# Patient Record
Sex: Female | Born: 1985 | Race: White | Hispanic: No | Marital: Married | Smoking: Never smoker
Health system: Southern US, Community
[De-identification: ages and names within clinical notes are randomized; demographics above are authoritative.]

## PROBLEM LIST (undated history)

## (undated) ENCOUNTER — Inpatient Hospital Stay (HOSPITAL_COMMUNITY): Payer: Self-pay

## (undated) DIAGNOSIS — E039 Hypothyroidism, unspecified: Secondary | ICD-10-CM

## (undated) HISTORY — DX: Hypothyroidism, unspecified: E03.9

---

## 2016-01-02 LAB — OB RESULTS CONSOLE GC/CHLAMYDIA
CHLAMYDIA, DNA PROBE: NEGATIVE
GC PROBE AMP, GENITAL: NEGATIVE

## 2016-01-02 LAB — OB RESULTS CONSOLE RUBELLA ANTIBODY, IGM: Rubella: IMMUNE

## 2016-01-02 LAB — OB RESULTS CONSOLE HEPATITIS B SURFACE ANTIGEN: Hepatitis B Surface Ag: NEGATIVE

## 2016-01-02 LAB — OB RESULTS CONSOLE ABO/RH: RH Type: POSITIVE

## 2016-01-02 LAB — OB RESULTS CONSOLE RPR: RPR: NONREACTIVE

## 2016-01-02 LAB — OB RESULTS CONSOLE HIV ANTIBODY (ROUTINE TESTING): HIV: NONREACTIVE

## 2016-01-02 LAB — OB RESULTS CONSOLE ANTIBODY SCREEN: Antibody Screen: NEGATIVE

## 2016-07-08 NOTE — L&D Delivery Note (Signed)
Delivery Note At 4:42 AM a viable and healthy female was delivered via Vaginal, Spontaneous Delivery (Presentation:ROA, ;  vtx).  APGAR: 8, 9; weight 8 lb 11.9 oz (3966 g).   Placenta status: spontaneous, intact to path , .  Cord:  with the following complications:none .  Cord pH: none  Anesthesia:  epidural Episiotomy: None Lacerations:  Left Labial majus; left Vaginal Sulcus Suture Repair: 3.0 chromic Est. Blood Loss (mL): 300  Mom to postpartum.  Baby to Couplet care / Skin to Skin.  Jasmine Ray A 08/07/2016, 5:30 AM

## 2016-07-12 DIAGNOSIS — Z794 Long term (current) use of insulin: Secondary | ICD-10-CM | POA: Diagnosis not present

## 2016-07-12 LAB — OB RESULTS CONSOLE GBS: STREP GROUP B AG: POSITIVE

## 2016-07-13 DIAGNOSIS — Z9641 Presence of insulin pump (external) (internal): Secondary | ICD-10-CM | POA: Diagnosis not present

## 2016-07-13 DIAGNOSIS — E109 Type 1 diabetes mellitus without complications: Secondary | ICD-10-CM | POA: Diagnosis not present

## 2016-07-13 DIAGNOSIS — E1065 Type 1 diabetes mellitus with hyperglycemia: Secondary | ICD-10-CM | POA: Diagnosis not present

## 2016-07-19 DIAGNOSIS — Z9641 Presence of insulin pump (external) (internal): Secondary | ICD-10-CM | POA: Diagnosis not present

## 2016-07-19 DIAGNOSIS — E109 Type 1 diabetes mellitus without complications: Secondary | ICD-10-CM | POA: Diagnosis not present

## 2016-07-19 DIAGNOSIS — Z794 Long term (current) use of insulin: Secondary | ICD-10-CM | POA: Diagnosis not present

## 2016-07-19 DIAGNOSIS — E1065 Type 1 diabetes mellitus with hyperglycemia: Secondary | ICD-10-CM | POA: Diagnosis not present

## 2016-07-25 ENCOUNTER — Telehealth (HOSPITAL_COMMUNITY): Payer: Self-pay | Admitting: *Deleted

## 2016-07-25 ENCOUNTER — Encounter (HOSPITAL_COMMUNITY): Payer: Self-pay | Admitting: *Deleted

## 2016-07-26 DIAGNOSIS — Z794 Long term (current) use of insulin: Secondary | ICD-10-CM | POA: Diagnosis not present

## 2016-07-30 ENCOUNTER — Other Ambulatory Visit: Payer: Self-pay | Admitting: Obstetrics and Gynecology

## 2016-07-30 NOTE — Telephone Encounter (Signed)
Preadmission screen  

## 2016-08-02 DIAGNOSIS — Z794 Long term (current) use of insulin: Secondary | ICD-10-CM | POA: Diagnosis not present

## 2016-08-04 ENCOUNTER — Inpatient Hospital Stay (HOSPITAL_COMMUNITY): Payer: 59

## 2016-08-04 ENCOUNTER — Other Ambulatory Visit: Payer: Self-pay | Admitting: Obstetrics and Gynecology

## 2016-08-04 ENCOUNTER — Encounter (HOSPITAL_COMMUNITY): Payer: Self-pay | Admitting: *Deleted

## 2016-08-04 ENCOUNTER — Inpatient Hospital Stay (HOSPITAL_COMMUNITY)
Admission: AD | Admit: 2016-08-04 | Discharge: 2016-08-04 | Disposition: A | Discharge status: Home / Self Care | Payer: 59 | Source: Ambulatory Visit | Attending: Obstetrics and Gynecology | Admitting: Obstetrics and Gynecology

## 2016-08-04 DIAGNOSIS — O24013 Pre-existing diabetes mellitus, type 1, in pregnancy, third trimester: Secondary | ICD-10-CM

## 2016-08-04 DIAGNOSIS — Z3A38 38 weeks gestation of pregnancy: Secondary | ICD-10-CM | POA: Insufficient documentation

## 2016-08-04 DIAGNOSIS — O24319 Unspecified pre-existing diabetes mellitus in pregnancy, unspecified trimester: Secondary | ICD-10-CM

## 2016-08-04 DIAGNOSIS — O99283 Endocrine, nutritional and metabolic diseases complicating pregnancy, third trimester: Secondary | ICD-10-CM

## 2016-08-04 DIAGNOSIS — O24313 Unspecified pre-existing diabetes mellitus in pregnancy, third trimester: Secondary | ICD-10-CM

## 2016-08-04 DIAGNOSIS — O24419 Gestational diabetes mellitus in pregnancy, unspecified control: Secondary | ICD-10-CM

## 2016-08-04 DIAGNOSIS — E039 Hypothyroidism, unspecified: Secondary | ICD-10-CM

## 2016-08-04 DIAGNOSIS — O2402 Pre-existing diabetes mellitus, type 1, in childbirth: Secondary | ICD-10-CM | POA: Diagnosis not present

## 2016-08-04 NOTE — Discharge Instructions (Signed)
Third Trimester of Pregnancy °The third trimester is from week 29 through week 42, months 7 through 9. This trimester is when your unborn baby (fetus) is growing very fast. At the end of the ninth month, the unborn baby is about 20 inches in length. It weighs about 6-10 pounds. °Follow these instructions at home: °· Avoid all smoking, herbs, and alcohol. Avoid drugs not approved by your doctor. °· Do not use any tobacco products, including cigarettes, chewing tobacco, and electronic cigarettes. If you need help quitting, ask your doctor. You may get counseling or other support to help you quit. °· Only take medicine as told by your doctor. Some medicines are safe and some are not during pregnancy. °· Exercise only as told by your doctor. Stop exercising if you start having cramps. °· Eat regular, healthy meals. °· Wear a good support bra if your breasts are tender. °· Do not use hot tubs, steam rooms, or saunas. °· Wear your seat belt when driving. °· Avoid raw meat, uncooked cheese, and liter boxes and soil used by cats. °· Take your prenatal vitamins. °· Take 1500-2000 milligrams of calcium daily starting at the 20th week of pregnancy until you deliver your baby. °· Try taking medicine that helps you poop (stool softener) as needed, and if your doctor approves. Eat more fiber by eating fresh fruit, vegetables, and whole grains. Drink enough fluids to keep your pee (urine) clear or pale yellow. °· Take warm water baths (sitz baths) to soothe pain or discomfort caused by hemorrhoids. Use hemorrhoid cream if your doctor approves. °· If you have puffy, bulging veins (varicose veins), wear support hose. Raise (elevate) your feet for 15 minutes, 3-4 times a day. Limit salt in your diet. °· Avoid heavy lifting, wear low heels, and sit up straight. °· Rest with your legs raised if you have leg cramps or low back pain. °· Visit your dentist if you have not gone during your pregnancy. Use a soft toothbrush to brush your  teeth. Be gentle when you floss. °· You can have sex (intercourse) unless your doctor tells you not to. °· Do not travel far distances unless you must. Only do so with your doctor's approval. °· Take prenatal classes. °· Practice driving to the hospital. °· Pack your hospital bag. °· Prepare the baby's room. °· Go to your doctor visits. °Get help if: °· You are not sure if you are in labor or if your water has broken. °· You are dizzy. °· You have mild cramps or pressure in your lower belly (abdominal). °· You have a nagging pain in your belly area. °· You continue to feel sick to your stomach (nauseous), throw up (vomit), or have watery poop (diarrhea). °· You have bad smelling fluid coming from your vagina. °· You have pain with peeing (urination). °Get help right away if: °· You have a fever. °· You are leaking fluid from your vagina. °· You are spotting or bleeding from your vagina. °· You have severe belly cramping or pain. °· You lose or gain weight rapidly. °· You have trouble catching your breath and have chest pain. °· You notice sudden or extreme puffiness (swelling) of your face, hands, ankles, feet, or legs. °· You have not felt the baby move in over an hour. °· You have severe headaches that do not go away with medicine. °· You have vision changes. °This information is not intended to replace advice given to you by your health care provider. Make   sure you discuss any questions you have with your health care provider. Document Released: 09/18/2009 Document Revised: 11/30/2015 Document Reviewed: 08/25/2012 Elsevier Interactive Patient Education  2017 [place]. Introduction Patient Name: ________________________________________________ Patient Due Date: ____________________ What is a fetal movement count? A fetal movement count is the number of times that you feel your baby move during a certain amount of time. This may also be called a fetal kick count. A fetal movement count is recommended for  every pregnant woman. You may be asked to start counting fetal movements as early as week 28 of your pregnancy. Pay attention to when your baby is most active. You may notice your baby's sleep and wake cycles. You may also notice things that make your baby move more. You should do a fetal movement count:  When your baby is normally most active.  At the same time each day. A good time to count movements is while you are resting, after having something to eat and drink. How do I count fetal movements? 1. Find a quiet, comfortable area. Sit, or lie down on your side. 2. Write down the date, the start time and stop time, and the number of movements that you felt between those two times. Take this information with you to your health care visits. 3. For 2 hours, count kicks, flutters, swishes, rolls, and jabs. You should feel at least 10 movements during 2 hours. 4. You may stop counting after you have felt 10 movements. 5. If you do not feel 10 movements in 2 hours, have something to eat and drink. Then, keep resting and counting for 1 hour. If you feel at least 4 movements during that hour, you may stop counting. Contact a health care provider if:  You feel fewer than 4 movements in 2 hours.  Your baby is not moving like he or she usually does. Date: ____________ Start time: ____________ Stop time: ____________ Movements: ____________ Date: ____________ Start time: ____________ Stop time: ____________ Movements: ____________ Date: ____________ Start time: ____________ Stop time: ____________ Movements: ____________ Date: ____________ Start time: ____________ Stop time: ____________ Movements: ____________ Date: ____________ Start time: ____________ Stop time: ____________ Movements: ____________ Date: ____________ Start time: ____________ Stop time: ____________ Movements: ____________ Date: ____________ Start time: ____________ Stop time: ____________ Movements: ____________ Date: ____________  Start time: ____________ Stop time: ____________ Movements: ____________ Date: ____________ Start time: ____________ Stop time: ____________ Movements: ____________ This information is not intended to replace advice given to you by your health care provider. Make sure you discuss any questions you have with your health care provider. Document Released: 07/24/2006 Document Revised: 02/21/2016 Document Reviewed: 08/03/2015 Elsevier Interactive Patient Education  2017 [employer].

## 2016-08-04 NOTE — MAU Provider Note (Signed)
History     Chief Complaint  Patient presents with  . nst and [place]   31 yo G1P0 MWF  Class B DM @ 38 5/[redacted] week gestation presents for nST/BPP/AFI due to dropping insulin dosing. Pt has insulin pump and is on current basal rate of 36 down from 45. Notes good FM no ctx. Last NST Friday. Pt is followed by [facility]  OB History    Gravida Para Term Preterm AB Living   1             SAB TAB Ectopic Multiple Live Births                  Past Medical History:  Diagnosis Date  . Hypothyroidism     History reviewed. No pertinent surgical history.  History reviewed. No pertinent family history.  Social History  Substance Use Topics  . Smoking status: Never Smoker  . Smokeless tobacco: Never Used  . Alcohol use Not on file    Allergies: No Known Allergies  No prescriptions prior to admission.     Physical Exam   Blood pressure 121/75, pulse 95, temperature 97.5 F (36.4 C), temperature source Oral, resp. rate 16, last menstrual period 11/07/2015.  No exam performed today, antepartum testing. Tracing: baseline 140 (+) accels to 170-175 ui Sono: BPP 8/8, nl fluid  ED Course  IMP: Class B DM on insulin pump Hypothyroidism  P) daily kick ct. Labor prec. Keep appt tues for IOL. Cont monitor BS and current insulin regimen  MDM   Nailyn Dearinger A, MD 9:57 AM 08/04/2016

## 2016-08-04 NOTE — MAU Note (Signed)
Pt is a diabetic on insulin.  Sent in for NST and [place]. No complaints offered

## 2016-08-06 ENCOUNTER — Inpatient Hospital Stay (HOSPITAL_COMMUNITY): Payer: 59 | Admitting: Anesthesiology

## 2016-08-06 ENCOUNTER — Inpatient Hospital Stay (HOSPITAL_COMMUNITY)
Admission: RE | Admit: 2016-08-06 | Discharge: 2016-08-08 | DRG: 774 | Disposition: A | Discharge status: Home / Self Care | Payer: 59 | Source: Ambulatory Visit | Attending: Obstetrics and Gynecology | Admitting: Obstetrics and Gynecology

## 2016-08-06 VITALS — BP 112/65 | HR 69 | Temp 97.7°F | Resp 18 | Ht 65.0 in | Wt 159.0 lb

## 2016-08-06 DIAGNOSIS — O9081 Anemia of the puerperium: Secondary | ICD-10-CM | POA: Diagnosis not present

## 2016-08-06 DIAGNOSIS — Z9641 Presence of insulin pump (external) (internal): Secondary | ICD-10-CM | POA: Diagnosis present

## 2016-08-06 DIAGNOSIS — Z3A39 39 weeks gestation of pregnancy: Secondary | ICD-10-CM

## 2016-08-06 DIAGNOSIS — O411230 Chorioamnionitis, third trimester, not applicable or unspecified: Secondary | ICD-10-CM | POA: Diagnosis present

## 2016-08-06 DIAGNOSIS — O2402 Pre-existing diabetes mellitus, type 1, in childbirth: Secondary | ICD-10-CM | POA: Diagnosis not present

## 2016-08-06 DIAGNOSIS — E109 Type 1 diabetes mellitus without complications: Secondary | ICD-10-CM | POA: Diagnosis present

## 2016-08-06 DIAGNOSIS — E039 Hypothyroidism, unspecified: Secondary | ICD-10-CM | POA: Diagnosis present

## 2016-08-06 DIAGNOSIS — O99284 Endocrine, nutritional and metabolic diseases complicating childbirth: Secondary | ICD-10-CM | POA: Diagnosis present

## 2016-08-06 DIAGNOSIS — D62 Acute posthemorrhagic anemia: Secondary | ICD-10-CM | POA: Diagnosis not present

## 2016-08-06 DIAGNOSIS — O99824 Streptococcus B carrier state complicating childbirth: Secondary | ICD-10-CM | POA: Diagnosis present

## 2016-08-06 DIAGNOSIS — Z794 Long term (current) use of insulin: Secondary | ICD-10-CM

## 2016-08-06 DIAGNOSIS — O99283 Endocrine, nutritional and metabolic diseases complicating pregnancy, third trimester: Secondary | ICD-10-CM

## 2016-08-06 LAB — RPR: RPR Ser Ql: NONREACTIVE

## 2016-08-06 LAB — GLUCOSE, CAPILLARY
GLUCOSE-CAPILLARY: 156 mg/dL — AB (ref 65–99)
Glucose-Capillary: 72 mg/dL (ref 65–99)
Glucose-Capillary: 101 mg/dL — ABNORMAL HIGH (ref 65–99)
Glucose-Capillary: 128 mg/dL — ABNORMAL HIGH (ref 65–99)
Glucose-Capillary: 121 mg/dL — ABNORMAL HIGH (ref 65–99)

## 2016-08-06 LAB — CBC
HEMATOCRIT: 34.6 % — AB (ref 36.0–46.0)
HEMOGLOBIN: 11.6 g/dL — AB (ref 12.0–15.0)
MCH: 26.9 pg (ref 26.0–34.0)
MCHC: 33.5 g/dL (ref 30.0–36.0)
MCV: 80.3 fL (ref 78.0–100.0)
Platelets: 226 10*3/uL (ref 150–400)
RBC: 4.31 MIL/uL (ref 3.87–5.11)
RDW: 13.5 % (ref 11.5–15.5)
WBC: 9 10*3/uL (ref 4.0–10.5)

## 2016-08-06 LAB — TYPE AND SCREEN
ABO/RH(D): O POS
ANTIBODY SCREEN: NEGATIVE

## 2016-08-06 LAB — ABO/RH: ABO/RH(D): O POS

## 2016-08-06 MED ORDER — INSULIN PUMP
SUBCUTANEOUS | Status: DC
  Administered 2016-08-06 (×6): via SUBCUTANEOUS
  Administered 2016-08-07: 0.55 via SUBCUTANEOUS
  Administered 2016-08-07 (×2): 0.75 via SUBCUTANEOUS
  Administered 2016-08-07: 0.55 via SUBCUTANEOUS
  Administered 2016-08-07: 0.75 via SUBCUTANEOUS
  Administered 2016-08-07 (×3): via SUBCUTANEOUS
  Administered 2016-08-08: 1.1 via SUBCUTANEOUS
  Filled 2016-08-06: qty 1

## 2016-08-06 MED ORDER — OXYTOCIN 40 UNITS IN LACTATED RINGERS INFUSION - SIMPLE MED: 1.0000 m[IU]/min | INTRAVENOUS | Status: DC

## 2016-08-06 MED ORDER — EPHEDRINE 5 MG/ML INJ
10.0000 mg | INTRAVENOUS | Status: DC | PRN
  Filled 2016-08-06: qty 4

## 2016-08-06 MED ORDER — LACTATED RINGERS IV SOLN: 500.0000 mL | Freq: Once | INTRAVENOUS | Status: DC

## 2016-08-06 MED ORDER — AMPICILLIN SODIUM 2 G IJ SOLR
2.0000 g | Freq: Four times a day (QID) | INTRAMUSCULAR | Status: DC
  Administered 2016-08-07: 2 g via INTRAVENOUS
  Filled 2016-08-06 (×3): qty 2000

## 2016-08-06 MED ORDER — SOD CITRATE-CITRIC ACID 500-334 MG/5ML PO SOLN: 30.0000 mL | ORAL | Status: DC | PRN

## 2016-08-06 MED ORDER — OXYCODONE-ACETAMINOPHEN 5-325 MG PO TABS: 2.0000 | ORAL_TABLET | ORAL | Status: DC | PRN

## 2016-08-06 MED ORDER — OXYTOCIN BOLUS FROM INFUSION
500.0000 mL | Freq: Once | INTRAVENOUS | Status: AC
  Administered 2016-08-07: 500 mL via INTRAVENOUS

## 2016-08-06 MED ORDER — OXYTOCIN 40 UNITS IN LACTATED RINGERS INFUSION - SIMPLE MED
1.0000 m[IU]/min | INTRAVENOUS | Status: DC
  Administered 2016-08-06: 1 m[IU]/min via INTRAVENOUS
  Filled 2016-08-06: qty 1000

## 2016-08-06 MED ORDER — ONDANSETRON HCL 4 MG/2ML IJ SOLN
4.0000 mg | Freq: Four times a day (QID) | INTRAMUSCULAR | Status: DC | PRN
  Administered 2016-08-06: 4 mg via INTRAVENOUS
  Filled 2016-08-06: qty 2

## 2016-08-06 MED ORDER — PENICILLIN G POTASSIUM 5000000 UNITS IJ SOLR
5.0000 10*6.[IU] | Freq: Once | INTRAVENOUS | Status: AC
  Administered 2016-08-06: 5 10*6.[IU] via INTRAVENOUS
  Filled 2016-08-06: qty 5

## 2016-08-06 MED ORDER — LIDOCAINE HCL (PF) 1 % IJ SOLN
30.0000 mL | INTRAMUSCULAR | Status: DC | PRN
  Filled 2016-08-06: qty 30

## 2016-08-06 MED ORDER — FENTANYL 2.5 MCG/ML BUPIVACAINE 1/10 % EPIDURAL INFUSION (WH - ANES)
14.0000 mL/h | INTRAMUSCULAR | Status: DC | PRN
  Administered 2016-08-06 – 2016-08-07 (×2): 14 mL/h via EPIDURAL
  Filled 2016-08-06 (×2): qty 100

## 2016-08-06 MED ORDER — OXYCODONE-ACETAMINOPHEN 5-325 MG PO TABS: 1.0000 | ORAL_TABLET | ORAL | Status: DC | PRN

## 2016-08-06 MED ORDER — TERBUTALINE SULFATE 1 MG/ML IJ SOLN
0.2500 mg | Freq: Once | INTRAMUSCULAR | Status: DC | PRN
  Filled 2016-08-06: qty 1

## 2016-08-06 MED ORDER — PENICILLIN G POT IN DEXTROSE 60000 UNIT/ML IV SOLN
3.0000 10*6.[IU] | INTRAVENOUS | Status: DC
  Administered 2016-08-06 (×3): 3 10*6.[IU] via INTRAVENOUS
  Filled 2016-08-06 (×4): qty 50

## 2016-08-06 MED ORDER — BUTORPHANOL TARTRATE 2 MG/ML IJ SOLN: 2.0000 mg | INTRAMUSCULAR | Status: DC | PRN

## 2016-08-06 MED ORDER — OXYTOCIN 10 UNIT/ML IJ SOLN: 10.0000 [IU] | Freq: Once | INTRAMUSCULAR | Status: DC

## 2016-08-06 MED ORDER — OXYTOCIN 40 UNITS IN LACTATED RINGERS INFUSION - SIMPLE MED: 2.5000 [IU]/h | INTRAVENOUS | Status: DC

## 2016-08-06 MED ORDER — DIPHENHYDRAMINE HCL 50 MG/ML IJ SOLN: 12.5000 mg | INTRAMUSCULAR | Status: DC | PRN

## 2016-08-06 MED ORDER — ACETAMINOPHEN 325 MG PO TABS
650.0000 mg | ORAL_TABLET | ORAL | Status: DC | PRN
  Filled 2016-08-06: qty 2

## 2016-08-06 MED ORDER — LIDOCAINE HCL (PF) 1 % IJ SOLN
INTRAMUSCULAR | Status: DC | PRN
  Administered 2016-08-06: 6 mL via EPIDURAL
  Administered 2016-08-06: 4 mL

## 2016-08-06 MED ORDER — LACTATED RINGERS IV SOLN
INTRAVENOUS | Status: DC
  Administered 2016-08-06: 08:00:00 via INTRAVENOUS

## 2016-08-06 MED ORDER — LACTATED RINGERS IV SOLN: 500.0000 mL | INTRAVENOUS | Status: DC | PRN

## 2016-08-06 MED ORDER — PHENYLEPHRINE 40 MCG/ML (10ML) SYRINGE FOR IV PUSH (FOR BLOOD PRESSURE SUPPORT)
80.0000 ug | PREFILLED_SYRINGE | INTRAVENOUS | Status: DC | PRN
  Administered 2016-08-06: 80 ug via INTRAVENOUS
  Filled 2016-08-06: qty 5

## 2016-08-06 MED ORDER — PHENYLEPHRINE 40 MCG/ML (10ML) SYRINGE FOR IV PUSH (FOR BLOOD PRESSURE SUPPORT)
80.0000 ug | PREFILLED_SYRINGE | INTRAVENOUS | Status: DC | PRN
  Filled 2016-08-06: qty 5
  Filled 2016-08-06: qty 10

## 2016-08-06 MED ORDER — ACETAMINOPHEN 500 MG PO TABS
1000.0000 mg | ORAL_TABLET | ORAL | Status: AC
  Administered 2016-08-07: 1000 mg via ORAL
  Filled 2016-08-06: qty 2

## 2016-08-06 NOTE — Anesthesia Procedure Notes (Signed)
Epidural Patient location during procedure: OB  Staffing Anesthesiologist: Ryder Chesmore  Preanesthetic Checklist Completed: patient identified, site marked, surgical consent, pre-op evaluation, timeout performed, IV checked, risks and benefits discussed and monitors and equipment checked  Epidural Patient position: sitting Prep: site prepped and draped and DuraPrep Patient monitoring: continuous pulse ox and blood pressure Approach: midline Location: L2-L3 Injection technique: LOR air  Needle:  Needle type: Tuohy  Needle gauge: 17 G Needle length: 9 cm and 9 Needle insertion depth: 5 cm cm Catheter type: closed end flexible Catheter size: 19 Gauge Catheter at skin depth: 10 cm Test dose: negative  Assessment Events: blood not aspirated, injection not painful, no injection resistance, negative IV test and no paresthesia  Additional Notes Dosing of Epidural:  1st dose, through catheter .............................................  Xylocaine 40 mg  2nd dose, through catheter, after waiting 3 minutes.........Xylocaine 60 mg    As each dose occurred, patient was free of IV sx; and patient exhibited no evidence of SA injection.  Patient is more comfortable after epidural dosed. Please see RN's note for documentation of vital signs,and FHR which are stable.  Patient reminded not to try to ambulate with numb legs, and that an RN must be present when she attempts to get up.        

## 2016-08-06 NOTE — Progress Notes (Signed)
S: notes some rectal pressure  O: BP 118/64   Pulse 89   Temp 99.4 F (37.4 C) (Axillary)   Resp 18   Ht 5\' 5"  (1.651 m)   Wt 72.1 kg (159 lb)   LMP 11/07/2015   SpO2 98%   BMI 26.46 kg/m  VE fully (+) 1 station small caput  Tracing: baseline 150 (+) accels some variables Ctx q 2 mins  CBG (last 3)   Recent Labs  08/06/16 1431 08/06/16 1831 08/06/16 2036  GLUCAP 101* 128* 121*    IMP: Complete  Intrapartum fever w/o prolonged ROM and well treated GBS on IV PCN Class B DM Term P) tylenol, right exaggerated sims. Push vs labor vtx down. IVF bolus

## 2016-08-06 NOTE — Progress Notes (Signed)
S; comfort ble . Sitting on ball  VS: afebrile Pitocin 7 miu VE deferred CBG 97 Tracing cat 1 IMP: class B DM GBS cx (+)  On IV PCN P) defer amniotomy. Change pitocin to 74miu interval. Cont CBG

## 2016-08-06 NOTE — Progress Notes (Signed)
S> breathing with ctx   O:  Epidural in progress BP 126/77   Pulse 74   Temp 97.7 F (36.5 C) (Oral)   Resp 16   Ht 5\' 5"  (1.651 m)   Wt 72.1 kg (159 lb)   LMP 11/07/2015   SpO2 98%   BMI 26.46 kg/m   VE per RN 5.5cm  Tracing cat 1 Ctx q 2 mins  IMP: Active phase labor Class B DM Term GBS cx (+) P) Epidural. Amniotomy with placement of IUPC. Cont CBG q 2hrs. Cont pitocin   Addendum: amniotomy clear fluid IUPC placed VE 4/60/-2

## 2016-08-06 NOTE — Anesthesia Preprocedure Evaluation (Addendum)
Anesthesia Evaluation  Patient identified by MRN, date of birth, ID band Patient awake    Reviewed: Allergy & Precautions, H&P , Patient's Chart, lab work & pertinent test results  Airway Mallampati: II  TM Distance: >3 FB Neck ROM: full    Dental  (+) Teeth Intact   Pulmonary    breath sounds clear to auscultation       Cardiovascular  Rhythm:regular Rate:Normal     Neuro/Psych    GI/Hepatic   Endo/Other  diabetes, Type 1, Insulin Dependent  Renal/GU      Musculoskeletal   Abdominal   Peds  Hematology   Anesthesia Other Findings       Reproductive/Obstetrics (+) Pregnancy                            Anesthesia Physical Anesthesia Plan  ASA: II  Anesthesia Plan: Epidural   Post-op Pain Management:    Induction:   Airway Management Planned:   Additional Equipment:   Intra-op Plan:   Post-operative Plan:   Informed Consent: I have reviewed the patients History and Physical, chart, labs and discussed the procedure including the risks, benefits and alternatives for the proposed anesthesia with the patient or authorized representative who has indicated his/her understanding and acceptance.   Dental Advisory Given  Plan Discussed with:   Anesthesia Plan Comments: (Labs checked- platelets confirmed with RN in room. Fetal heart tracing, per RN, reported to be stable enough for sitting procedure. Discussed epidural, and patient consents to the procedure:  included risk of possible headache,backache, failed block, allergic reaction, and nerve injury. This patient was asked if she had any questions or concerns before the procedure started.)        Anesthesia Quick Evaluation

## 2016-08-06 NOTE — H&P (Signed)
Jasmine Ray is a 31 y.o. female presenting for IOL 2nd to Class B DM . Dum[place] omplicated by hypothyroidism controlled and (+) GBS OB History    Gravida Para Term Preterm AB Living   1             SAB TAB Ectopic Multiple Live Births                 Past Medical History:  Diagnosis Date  . Hypothyroidism    No past surgical history on file. Family History: family history is not on file. Social History:  reports that she has never smoked. She has never used smokeless tobacco. Her alcohol and drug histories are not on file.     Maternal Diabetes: Yes:  Diabetes Type:  Insulin/Medication controlled Genetic Screening: Normal Maternal Ultrasounds/Referrals: Normal Fetal Ultrasounds or other Referrals:  Fetal echonl Maternal Substance Abuse:  No Significant Maternal Medications:  Meds include: Syntroid Other: insulin Significant Maternal Lab Results:  Lab values include: Group B Strep positive Other Comments:  hypothyroidism, Class B DM  Review of Systems  Constitutional: Negative.   All other systems reviewed and are negative.  History Dilation: 2 Effacement (%): 50 Station: -2 Exam by:: middleton rn Blood pressure 127/72, pulse 78, temperature 98.4 F (36.9 C), temperature source Oral, resp. rate 16, height 5\' 5"  (1.651 m), weight 72.1 kg (159 lb), last menstrual period 11/07/2015. Maternal Exam:  Uterine Assessment: Contraction frequency is rare.   Abdomen: Patient reports no abdominal tenderness. Estimated fetal weight is  7lb 5 oz.   Fetal presentation: vertex  Introitus: Normal vulva. Nitrazine test: not done.  Pelvis: adequate for delivery.   Cervix: Cervix evaluated by digital exam.     Physical Exam  Constitutional: She is oriented to person, place, and time. She appears well-developed and well-nourished.  HENT:  Head: Atraumatic.  Eyes: EOM are normal.  Neck: Neck supple.  Cardiovascular: Regular rhythm.   Respiratory: Breath sounds normal.  GI: Soft.   Musculoskeletal: She exhibits no edema.  Neurological: She is alert and oriented to person, place, and time.  Skin: Skin is warm and dry.  Psychiatric: She has a normal mood and affect.    Prenatal labs: ABO, Rh: --/--/O POS, O POS (01/30 [id number]) Antibody: NEG (01/30 0755) Rubella: Immune (06/27 0000) RPR: Nonreactive (06/27 0000)  HBsAg: Negative (06/27 0000)  HIV: Non-reactive (06/27 0000)  GBS: Positive (01/05 0000)  CBG 155 Assessment/Plan: Class B DM on insulin pump Hypothyroidism Term gestation GBS cx (+) P) admit  Routine labs. Pitocin induction. IV PCN. Cont insulin pump. Cont synthroid CBG q 2 hrs. Analgesic prn  Shia Delaine A 08/06/2016, 1:28 PM

## 2016-08-06 NOTE — Anesthesia Pain Management Evaluation Note (Signed)
  CRNA Pain Management Visit Note  Patient: Jasmine Ray, 31 y.o., female  "Hello I am a member of the anesthesia team at [facility]. We have an anesthesia team available at all times to provide care throughout the hospital, including epidural management and anesthesia for C-section. I don't know your plan for the delivery whether it a natural birth, water birth, IV sedation, nitrous supplementation, doula or epidural, but we want to meet your pain goals."   1.Was your pain managed to your expectations on prior hospitalizations?   No   2.What is your expectation for pain management during this hospitalization?     Epidural  3.How can we help you reach that goal?   Record the patient's initial score and the patient's pain goal.   Pain: 0  Pain Goal: 6 [facility] wants you to be able to say your pain was always managed very well.  Jabier Mutton 08/06/2016

## 2016-08-07 ENCOUNTER — Encounter (HOSPITAL_COMMUNITY): Payer: Self-pay

## 2016-08-07 LAB — GLUCOSE, CAPILLARY
GLUCOSE-CAPILLARY: 88 mg/dL (ref 65–99)
GLUCOSE-CAPILLARY: 133 mg/dL — AB (ref 65–99)
Glucose-Capillary: 75 mg/dL (ref 65–99)

## 2016-08-07 MED ORDER — ACETAMINOPHEN 500 MG PO TABS
1000.0000 mg | ORAL_TABLET | Freq: Four times a day (QID) | ORAL | Status: DC | PRN
  Administered 2016-08-07: 1000 mg via ORAL
  Filled 2016-08-07 (×2): qty 2

## 2016-08-07 MED ORDER — OXYCODONE HCL 5 MG PO TABS: 10.0000 mg | ORAL_TABLET | ORAL | Status: DC | PRN

## 2016-08-07 MED ORDER — GENTAMICIN SULFATE 40 MG/ML IJ SOLN
170.0000 mg | Freq: Three times a day (TID) | INTRAVENOUS | Status: DC
  Administered 2016-08-07: 170 mg via INTRAVENOUS
  Filled 2016-08-07 (×2): qty 4.25

## 2016-08-07 MED ORDER — ZOLPIDEM TARTRATE 5 MG PO TABS: 5.0000 mg | ORAL_TABLET | Freq: Every evening | ORAL | Status: DC | PRN

## 2016-08-07 MED ORDER — DEXTROSE IN LACTATED RINGERS 5 % IV SOLN
INTRAVENOUS | Status: DC
  Administered 2016-08-07: 04:00:00 via INTRAVENOUS

## 2016-08-07 MED ORDER — IBUPROFEN 600 MG PO TABS
600.0000 mg | ORAL_TABLET | Freq: Four times a day (QID) | ORAL | Status: DC
  Administered 2016-08-07 – 2016-08-08 (×6): 600 mg via ORAL
  Filled 2016-08-07 (×6): qty 1

## 2016-08-07 MED ORDER — OXYCODONE HCL 5 MG PO TABS: 5.0000 mg | ORAL_TABLET | ORAL | Status: DC | PRN

## 2016-08-07 MED ORDER — COCONUT OIL OIL
1.0000 "application " | TOPICAL_OIL | Status: DC | PRN
  Administered 2016-08-08: 1 via TOPICAL
  Filled 2016-08-07: qty 120

## 2016-08-07 MED ORDER — ACETAMINOPHEN 325 MG PO TABS
650.0000 mg | ORAL_TABLET | ORAL | Status: DC | PRN
  Administered 2016-08-08: 650 mg via ORAL
  Filled 2016-08-07: qty 2

## 2016-08-07 MED ORDER — BENZOCAINE-MENTHOL 20-0.5 % EX AERO
1.0000 "application " | INHALATION_SPRAY | CUTANEOUS | Status: DC | PRN
  Filled 2016-08-07: qty 56

## 2016-08-07 MED ORDER — FERROUS SULFATE 325 (65 FE) MG PO TABS
325.0000 mg | ORAL_TABLET | Freq: Two times a day (BID) | ORAL | Status: DC
  Administered 2016-08-07 – 2016-08-08 (×3): 325 mg via ORAL
  Filled 2016-08-07 (×3): qty 1

## 2016-08-07 MED ORDER — INSULIN PUMP
1.0000 | Freq: Three times a day (TID) | SUBCUTANEOUS | Status: DC
  Administered 2016-08-07 – 2016-08-08 (×3): 1 via SUBCUTANEOUS
  Filled 2016-08-07: qty 1

## 2016-08-07 MED ORDER — ONDANSETRON HCL 4 MG/2ML IJ SOLN: 4.0000 mg | INTRAMUSCULAR | Status: DC | PRN

## 2016-08-07 MED ORDER — PRENATAL MULTIVITAMIN CH
1.0000 | ORAL_TABLET | Freq: Every day | ORAL | Status: DC
  Administered 2016-08-07 – 2016-08-08 (×2): 1 via ORAL
  Filled 2016-08-07 (×2): qty 1

## 2016-08-07 MED ORDER — ONDANSETRON HCL 4 MG PO TABS: 4.0000 mg | ORAL_TABLET | ORAL | Status: DC | PRN

## 2016-08-07 MED ORDER — DIPHENHYDRAMINE HCL 25 MG PO CAPS: 25.0000 mg | ORAL_CAPSULE | Freq: Four times a day (QID) | ORAL | Status: DC | PRN

## 2016-08-07 MED ORDER — WITCH HAZEL-GLYCERIN EX PADS: 1.0000 "application " | MEDICATED_PAD | CUTANEOUS | Status: DC | PRN

## 2016-08-07 MED ORDER — SIMETHICONE 80 MG PO CHEW: 80.0000 mg | CHEWABLE_TABLET | ORAL | Status: DC | PRN

## 2016-08-07 MED ORDER — DEXTROSE 5 % IV BOLUS
50.0000 mL | Freq: Once | INTRAVENOUS | Status: AC
  Administered 2016-08-07: 50 mL via INTRAVENOUS

## 2016-08-07 MED ORDER — DIBUCAINE 1 % RE OINT: 1.0000 "application " | TOPICAL_OINTMENT | RECTAL | Status: DC | PRN

## 2016-08-07 MED ORDER — LEVOTHYROXINE SODIUM 88 MCG PO TABS
88.0000 ug | ORAL_TABLET | Freq: Every day | ORAL | Status: DC
  Administered 2016-08-07 – 2016-08-08 (×2): 88 ug via ORAL
  Filled 2016-08-07 (×3): qty 1

## 2016-08-07 MED ORDER — SENNOSIDES-DOCUSATE SODIUM 8.6-50 MG PO TABS
2.0000 | ORAL_TABLET | ORAL | Status: DC
  Administered 2016-08-07: 2 via ORAL
  Filled 2016-08-07: qty 2

## 2016-08-07 NOTE — Consult Note (Signed)
Neonatology Note:   Attendance at Delivery:    I was asked by Dr. Garwin Brothers to attend this vaginal delivery due to fetal distress, chorioamninitis and potential LGA status at term. The mother is a 31 y.o. female presenting for IOL 2nd to Class B DM. Zena omplicated by hypothyroidism controlled and (+) GBS, GBS pos (aIAP with PCN) with good prenatal care. ROM 9 hours before delivery, fluid clear. Infant vigorous with good spontaneous cry and tone after warming, drying and stimulation. Needed only bulb suctioning. Ap 8/9. Lungs clear to ausc in DR. Right arm bruising.  Clavicles intact.  To CN to care of Pediatrician.  Monia Sabal Katherina Mires, MD

## 2016-08-07 NOTE — Lactation Note (Signed)
This note was copied from a baby's chart. Lactation Consultation Note  Patient Name: Jasmine Ray [id number] Date: 08/07/2016 Reason for consult: Follow-up assessment Mom called for assist with latch. Mom had baby in cradle hold when [facility] arrived. Assisted Mom to change positions to cross cradle for more depth with latch. Mom reports no discomfort. Baby demonstrating good suckling bursts with swallows noted. Basic teaching reviewed with Mom. Advised to continue to Bf with feeding ques, 8-12 times or more in 24 hours. Mom is IDDM, Randel Books has had stable blood sugars. Encouraged to call for assist as needed with latch.   Maternal Data    Feeding Feeding Type: Breast Fed Length of feed: 5 min (per mom )  LATCH Score/Interventions Latch: Grasps breast easily, tongue down, lips flanged, rhythmical sucking.  Audible Swallowing: Spontaneous and intermittent  Type of Nipple: Everted at rest and after stimulation  Comfort (Breast/Nipple): Soft / non-tender     Hold (Positioning): Assistance needed to correctly position infant at breast and maintain latch. Intervention(s): Breastfeeding basics reviewed;Support Pillows;Position options;Skin to skin  LATCH Score: 9  Lactation Tools Discussed/Used     Consult Status Consult Status: Follow-up Date: 08/08/16 Follow-up type: In-patient    Katrine Coho 08/07/2016, 4:28 PM

## 2016-08-07 NOTE — Progress Notes (Signed)
Inpatient Diabetes Program Recommendations  Inpatient Diabetes Program Recommendations  Diabetes Treatment Program Recommendations  ADA Standards of Care 2016 Diabetes in Pregnancy Target Glucose Ranges:  Fasting: 60 - 90 mg/dL Preprandial: 60 - 105 mg/dL 1 hr postprandial: Less than 140mg /dL (from first bite of meal) 2 hr postprandial: Less than 120 mg/dL (from first bite of meal)      Lab Results  Component Value Date   GLUCAP 88 08/07/2016   Review of Glycemic Control Results for Jasmine Ray, Jasmine Ray (MRN [id number]) as of 08/07/2016 08:09  Ref. Range 08/06/2016 11:34 08/06/2016 14:31 08/06/2016 18:31 08/06/2016 20:36 08/07/2016 03:14  Glucose-Capillary Latest Ref Range: 65 - 99 mg/dL 72 101 (H) 128 (H) 121 (H) 88   Diabetes history: DM1 Outpatient Diabetes medications: Insulin pump Current orders for Inpatient glycemic control: Insulin Pump    Spoke with patient by phone regarding insulin pump. Patient has placed insulin pump settings back to pre pregnacy settings. Current insulin pump settings: 12A-3A    0.65 3A-6A       0.85 6A-10A     1.1 10A-2P      .65 2P-5P        .75 5P-12P       .55 Patient is now changing her correction and carbohydrate coverage to pre pregancy. Will follow.  Thank you, [name]. [name], RN, MSN, CDE Inpatient Glycemic Control Team Team Pager [phone] (8am-5pm) 08/07/2016 8:18 AM

## 2016-08-07 NOTE — Progress Notes (Signed)
INTERVAL NOTE:  S:  Sitting in bed, FOB at [facility] w. Newborn, min cramping, no voids yet, small bleed, denies HA/NV/dizziness  Baby girl Lucy  O:  VSS, AAO x 3, NAD  FF bellow U  Scant lochia  Mod labial edema, ice pack on  A / P:   PPD #0  IDDM, stable BG, insulin requirements down to pre-pregnancy setting, diabetic Associate Professor following.  Stable post partum  Encourage voids q 2-3 hrs while awake  Routine PP orders  PAUL,DANIELA, CNM, MSN  08/07/2016 10:02 AM

## 2016-08-07 NOTE — Anesthesia Postprocedure Evaluation (Signed)
Anesthesia Post Note  Patient: Jasmine Ray  Procedure(s) Performed: * No procedures listed *  Patient location during evaluation: Mother Baby Anesthesia Type: Epidural Level of consciousness: awake Pain management: satisfactory to patient Vital Signs Assessment: post-procedure vital signs reviewed and stable Respiratory status: spontaneous breathing Cardiovascular status: stable Anesthetic complications: no        Last Vitals:  Vitals:   08/07/16 0730 08/07/16 1132  BP: (!) 112/53 111/65  Pulse: 68 73  Resp: 17 18  Temp: 37.1 C 36.8 C    Last Pain:  Vitals:   08/07/16 1132  TempSrc: Oral  PainSc:    Pain Goal:                 [employer]

## 2016-08-07 NOTE — Progress Notes (Signed)
ANTIBIOTIC CONSULT NOTE - INITIAL  Pharmacy Consult for Gentamicin Indication: Chorioamnionitis   No Known Allergies  Patient Measurements: Height: 5\' 5"  (165.1 cm) Weight: 159 lb (72.1 kg) IBW/kg (Calculated) : 57 Adjusted Body Weight: 61.5 kg  Vital Signs: Temp: 99.4 F (37.4 C) (01/31 0044) Temp Source: Axillary (01/31 0044) BP: 138/75 (01/31 0030) Pulse Rate: 80 (01/31 0030)  Labs:  Recent Labs  08/06/16 0755  WBC 9.0  HGB 11.6*  PLT 226   No results for input(s): GENTTROUGH, GENTPEAK, GENTRANDOM in the last 72 hours.   Microbiology: Recent Results (from the past 720 hour(s))  OB RESULT CONSOLE Group B Strep     Status: None   Collection Time: 07/12/16 12:00 AM  Result Value Ref Range Status   GBS Positive  Final    Medications:  Ampicillin 2 grams Q6 hours  Assessment: 31 y.o. female G1P0 at [redacted]w[redacted]d admitted for IOL 2/2 class B DM. Starting ampicillin and gentamicin for presumed chorioamnionitis Estimated Ke = 0.314 hr-1, Vd = 0.39 L/kg  Goal of Therapy:  Gentamicin peak 6-8 mg/L and Trough < 1 mg/L  Plan:   Gentamicin 170 mg IV every 8 hrs  Check Scr with next labs if gentamicin continued. Will check gentamicin levels if continued > 72hr or clinically indicated.  Sejal Cofield Scarlett 08/07/2016,12:52 AM

## 2016-08-07 NOTE — Progress Notes (Signed)
Jasmine Ray is a 31 y.o. G1P0 at [redacted]w[redacted]d by LMP admitted for induction of labor due to Gestational diabetes.  Subjective: Called for possible vacuum. Pushing for almost 3 hrs  Objective: BP 131/82   Pulse 86   Temp 98.5 F (36.9 C) (Axillary)   Resp 18   Ht 5\' 5"  (1.651 m)   Wt 72.1 kg (159 lb)   LMP 11/07/2015   SpO2 98%   BMI 26.46 kg/m  No intake/output data recorded. Total I/O In: -  Out: 150 [Urine:150]  FHT:  FHR: 170's bpm, variability: moderate,  accelerations:  Abscent,  decelerations:  Present variables UC:   Ctx q 2 - 2 1/2 mins SVE:   10 cm dilated, 100% effaced, (+2) station with caput Tracing:cat 2  Labs: Lab Results  Component Value Date   WBC 9.0 08/06/2016   HGB 11.6 (L) 08/06/2016   HCT 34.6 (L) 08/06/2016   MCV 80.3 08/06/2016   PLT 226 08/06/2016    Assessment / Plan: Complete  Chorioamnionitis on Amp/Gent Class B DM Hypothyroidism P) disc issue/risk of vacuum vs C/S, cat 2 tracing. Await CNM arrival prior to attempting any intervention. OR on standby. Cont pushing   Anticipated MOD:  guarded. At risk for shoulder dystocia  Damon Baisch A 08/07/2016, 4:22 AM

## 2016-08-08 DIAGNOSIS — D62 Acute posthemorrhagic anemia: Secondary | ICD-10-CM | POA: Diagnosis not present

## 2016-08-08 LAB — CBC
HEMATOCRIT: 25.7 % — AB (ref 36.0–46.0)
HEMOGLOBIN: 8.9 g/dL — AB (ref 12.0–15.0)
MCH: 27.6 pg (ref 26.0–34.0)
MCHC: 34.6 g/dL (ref 30.0–36.0)
MCV: 79.6 fL (ref 78.0–100.0)
Platelets: 176 10*3/uL (ref 150–400)
RBC: 3.23 MIL/uL — AB (ref 3.87–5.11)
RDW: 13.9 % (ref 11.5–15.5)
WBC: 11.8 10*3/uL — AB (ref 4.0–10.5)

## 2016-08-08 LAB — GLUCOSE, CAPILLARY
GLUCOSE-CAPILLARY: 107 mg/dL — AB (ref 65–99)
Glucose-Capillary: 87 mg/dL (ref 65–99)

## 2016-08-08 MED ORDER — IRON POLYSACCH CMPLX-B12-FA 150-0.025-1 MG PO CAPS: 1.0000 | ORAL_CAPSULE | Freq: Every day | ORAL | 0 refills | Status: AC

## 2016-08-08 MED ORDER — MAGNESIUM OXIDE 400 (241.3 MG) MG PO TABS
400.0000 mg | ORAL_TABLET | Freq: Every day | ORAL | Status: DC
  Filled 2016-08-08 (×2): qty 1

## 2016-08-08 MED ORDER — OXYCODONE HCL 5 MG PO TABS: 5.0000 mg | ORAL_TABLET | ORAL | 0 refills | Status: AC | PRN

## 2016-08-08 MED ORDER — IBUPROFEN 600 MG PO TABS: 600.0000 mg | ORAL_TABLET | Freq: Four times a day (QID) | ORAL | 0 refills | Status: AC

## 2016-08-08 MED ORDER — MAGNESIUM OXIDE 400 (241.3 MG) MG PO TABS: 400.0000 mg | ORAL_TABLET | Freq: Every day | ORAL | 0 refills | Status: AC

## 2016-08-08 NOTE — Progress Notes (Signed)
PPD # 1 SVD Information for the patient's newborn:  Jasmine Ray, Jasmine Ray [id number]  female    breast feeding   S:  Reports feeling well and ready to go home.               Tolerating po/ No nausea or vomiting             Bleeding is light             Pain controlled with ibuprofen             Up ad lib / ambulatory / voiding without difficulties       O:  A & O x 3, in no apparent distress              VS:  Vitals:   08/07/16 0730 08/07/16 1132 08/07/16 1800 08/08/16 0554  BP: (!) 112/53 111/65 135/74 112/65  Pulse: 68 73 81 69  Resp: 17 18 18 18   Temp: 98.8 F (37.1 C) 98.2 F (36.8 C) 98 F (36.7 C) 97.7 F (36.5 C)  TempSrc: Oral Oral Oral Oral  SpO2:      Weight:      Height:        LABS:  Recent Labs  08/06/16 0755 08/08/16 0548  WBC 9.0 11.8*  HGB 11.6* 8.9*  HCT 34.6* 25.7*  PLT 226 176    Blood type: --/--/O POS, O POS (01/30 0755)  Rubella: Immune (06/27 0000)   I&O: I/O last 3 completed shifts: In: -  Out: 850 [Urine:550; Blood:300]          No intake/output data recorded.  Lungs: Clear and unlabored  Heart: regular rate and rhythm / no murmurs  Abdomen: soft, non-tender, non-distended             Fundus: firm, non-tender, U-2  Perineum: no edema  Lochia: light  Extremities: minimal pedal edema, no calf pain or tenderness    A/P: PPD # 1 31 y.o., G1P1001   Principal Problem:   Postpartum care following vaginal delivery 1/31  Routine post partum orders Active Problems:   DM (diabetes mellitus), type 1 (HCC)   Acute blood loss anemia  Fe and Mag oxide supplements    Doing well - stable status  Anticipate discharge today    Crista Luria, BSN, SNM 08/08/2016, 11:26 AM

## 2016-08-08 NOTE — Lactation Note (Signed)
This note was copied from a baby's chart. Lactation Consultation Note: Mom reports nipples are sore especially the left one. Baby nurses better on left. Assisted mom with latch to right breast. Encouraged massage and hand expressions before latching. Tried football hold but baby fussy and on and orr the breast. Used cross cradle and baby latched well. Nursed for 15 min which mom reports is the best she has done. Nursing on the right breast when I left room. Comfort gels given with instructions for use. Reviewed engorgement prevention and treatment. Reviewed our phone number, OP appointments and BFSG as resources for support after DC. No further questions at present. To call prn  Patient Name: Jasmine Ray [id number] Date: 08/08/2016 Reason for consult: Follow-up assessment   Maternal Data Formula Feeding for Exclusion: No Has patient been taught Hand Expression?: Yes Does the patient have breastfeeding experience prior to this delivery?: No  Feeding Feeding Type: Breast Fed Length of feed: 25 min  LATCH Score/Interventions Latch: Grasps breast easily, tongue down, lips flanged, rhythmical sucking.  Audible Swallowing: A few with stimulation  Type of Nipple: Everted at rest and after stimulation  Comfort (Breast/Nipple): Filling, red/small blisters or bruises, mild/mod discomfort  Problem noted: Mild/Moderate discomfort Interventions (Mild/moderate discomfort): Hand massage;Hand expression;Comfort gels  Hold (Positioning): Assistance needed to correctly position infant at breast and maintain latch. Intervention(s): Breastfeeding basics reviewed  LATCH Score: 7  Lactation Tools Discussed/Used WIC Program: No   Consult Status Consult Status: Follow-up Date: 08/09/16 Follow-up type: In-patient    Truddie Crumble 08/08/2016, 12:50 PM

## 2016-08-08 NOTE — Discharge Summary (Signed)
Obstetric Discharge Summary Reason for Admission: induction of labor - IDDM Prenatal Procedures: NST and ultrasound Intrapartum Procedures: spontaneous vaginal delivery and GBS prophylaxis Postpartum Procedures: none Complications-Operative and Postpartum: vaginal laceration Hemoglobin  Date Value Ref Range Status  08/08/2016 8.9 (L) 12.0 - 15.0 g/dL Final    Comment:    DELTA CHECK NOTED REPEATED TO VERIFY    HCT  Date Value Ref Range Status  08/08/2016 25.7 (L) 36.0 - 46.0 % Final    Physical Exam:  General: alert, cooperative and no distress Lochia: appropriate Uterine Fundus: firm Incision: healing well DVT Evaluation: No evidence of DVT seen on physical exam.  Discharge Diagnoses: Term Pregnancy-delivered and DM class B - IDDM / ABL anemia  Discharge Information: Date: 08/08/2016 Activity: pelvic rest Diet: routine Medications: PNV, Ibuprofen, Iron, Percocet and insulin pump and magnesium Condition: stable Instructions: refer to practice specific booklet Discharge to: home Follow-up Information    COUSINS,SHERONETTE A, MD. Schedule an appointment as soon as possible for a visit in 6 week(s).   Specialty:  Obstetrics and Gynecology Why:  see Endocrine MD at [facility] for diabetes management - call for apt postpartum Contact information: [address] [place] [facility] [address] [phone]           Newborn Data: Live born female  Birth Weight: 8 lb 11.9 oz (3966 g) APGAR: 8, 9  Home with mother.  Artelia Laroche 08/08/2016, 11:37 AM

## 2016-08-09 ENCOUNTER — Ambulatory Visit: Payer: Self-pay

## 2016-08-09 NOTE — Lactation Note (Signed)
This note was copied from a baby's chart. Lactation Consultation Note  P1, Baby 64 hours old.  8% weight loss.  Going home on phototherapy. Baby has not stooled in more than 24 hours. Observed feeding and baby comes off and on and seems to tire at breast. Mother can easily hand express good flow of colostrum. Noted indention in tongue and some mid posterior tightness.  Suggest mother discuss with Pediatrician and make OP appt at [facility]. Mom encouraged to feed baby 8-12 times/24 hours and with feeding cues at least q 2-3 until baby stools. Breastfeed on both breasts. Suggest mother post pump for 10-15 min at least 4-6 times a day and give volume back at next feeding. Reviewed engorgement care and monitoring voids/stools.   Patient Name: Jasmine Ray [id number] Date: 08/09/2016     Maternal Data    Feeding Feeding Type: Breast Fed Length of feed: 15 min  LATCH Score/Interventions Latch: Grasps breast easily, tongue down, lips flanged, rhythmical sucking.  Audible Swallowing: Spontaneous and intermittent Intervention(s): Skin to skin  Type of Nipple: Everted at rest and after stimulation  Comfort (Breast/Nipple): Filling, red/small blisters or bruises, mild/mod discomfort  Problem noted: Mild/Moderate discomfort Interventions (Mild/moderate discomfort): Hand massage;Hand expression;Comfort gels  Hold (Positioning): Assistance needed to correctly position infant at breast and maintain latch. Intervention(s): Breastfeeding basics reviewed;Support Pillows;Position options;Skin to skin  LATCH Score: 8  Lactation Tools Discussed/Used     Consult Status      Vivianne Master [facility] 08/09/2016, 10:59 AM

## 2016-08-31 ENCOUNTER — Telehealth (HOSPITAL_COMMUNITY): Payer: Self-pay | Admitting: Lactation Services

## 2016-08-31 NOTE — Telephone Encounter (Signed)
Mother left message stating she is finished with medications from Mastitis and now has something on her nipple.  Left her a message about plugged ducts and what to do.  Told her to call back to explain further.

## 2016-09-06 ENCOUNTER — Ambulatory Visit (HOSPITAL_COMMUNITY)
Admission: RE | Admit: 2016-09-06 | Discharge: 2016-09-06 | Disposition: A | Discharge status: Home / Self Care | Payer: 59 | Source: Ambulatory Visit | Attending: Obstetrics & Gynecology | Admitting: Obstetrics & Gynecology

## 2016-09-06 DIAGNOSIS — Z029 Encounter for administrative examinations, unspecified: Secondary | ICD-10-CM | POA: Insufficient documentation

## 2016-09-06 NOTE — Lactation Note (Addendum)
Lactation Consult; Weight today 4866 g 10 # 11.6 oz. Mom reports great weight gain. Concerned about pain in breast. Mom has had mastitis this week Will finish antibiotics tomorrow. No redness noted today. Reports she had white patches on tips of nipples but they have gone away. Has small swollen area at side of nipple but goes away after nursing. Mom reports pain with initial latch  With deeper latch mom reports that feels better.   Is diabetic and has frequent yeast infections. Has been using lanolin- suggested stopping lanolin and to use EBM or coconut oil instead. Reviewed s/s of yeast or thrush and encouraged to call OB and Ped is she notices symptoms. Reports no pain on left breast. Encouragement given. No further questions at present. To call prn  Mother's reason for visit:  Concerned about pain in right breast Visit Type:  Breast assessment Appointment Notes:  Has had mastitis, will finish antibiotics tomorrow Consult:  Initial Lactation Consultant:  Jasmine Ray  ________________________________________________________________________ 27 Name:  Jasmine Ray Date of Birth:  08/07/2017 Pediatrician:  Jasmine Ray Gender:  female Gestational Age: [redacted]w[redacted]d (At Birth) Birth Weight:  8 lb 11.9 oz (3966 g) Weight at Discharge:  Weight: 8 lb 0.8 oz (3650 g)               Date of Discharge:  08/09/2016      Filed Weights   08/07/16 0442 08/07/16 2350 08/09/16 0000  Weight: 8 lb 11.9 oz (3966 g) 8 lb 6.9 oz (3825 g) 8 lb 0.8 oz (3650 g      ________________________________________________________________________  Mother's Name: Jasmine Ray    Breastfeeding Experience:  P1  Maternal Medications:  Antibiotics, insulin  ________________________________________________________________________  Breastfeeding History (Post Discharge)  Frequency of breastfeeding:  q 3 hours Duration of feeding:  20-30 min    Pumping  Type of pump:  Medela pump in style Frequency:  occas   Infant  Intake and Output Assessment  Voids:  QS in 24 hrs.  Color:  Clear yellow Stools:  QS in 24 hrs.  Color:  Yellow  ________________________________________________________________________  Maternal Breast Assessment  Breast:  Filling,, reports mastitis was in right breast, upper half of breast no redness noted now. Mom reports she had white patches on tips of nipple but they are gone now. Small swollen area on nipple at 8:00 position which is gone after nursing Nipple:  Erect  _______________________________________________________________________ Feeding Assessment/Evaluation  Initial feeding assessment:  I Positioning:  Cradle Right breast  LATCH documentation:  Latch:  2 = Grasps breast easily, tongue down, lips flanged, rhythmical sucking.  Audible swallowing:  2 = Spontaneous and intermittent  Type of nipple:  2 = Everted at rest and after stimulation  Comfort (Breast/Nipple):  2 = Soft / non-tender  Hold (Positioning):  1 = Assistance needed to correctly position infant at breast and maintain latch  LATCH score:  9  Attached assessment:  Deep, after assist- encouraged to wait for wide open mouth to get deep latch  Lips flanged:  Yes.    Lips untucked:  Yes.    Suck assessment:  Nutritive  Pre-feed weight:  4866 g  10 # 11.6 oz Post-feed weight:  4888 g  10 # 12.4 oz Amount transferred:  22 ml  Jasmine Ray latched well to right breast after I encouraged mom to wait for wide open mouth. Mom reports that feels better, Mom reports breast feels softer and swollen area at nipple is gone  Pre-feed weight:  4888  g   10 # 12.4 oz Post-feed weight:  4914 g  10 # 13.3 oz Amount transferred:  26 ml  Jasmine Ray latched well to left breast. Mom reports this breast makes more milk. Lots of swallows noted. No pain with latch,.   Total amount pumped post feed:  Did not pump since she nursed on both breasts, Total amount transferred:  48 ml Total supplement given:  0 ml

## 2016-09-09 DIAGNOSIS — R040 Epistaxis: Secondary | ICD-10-CM | POA: Diagnosis not present

## 2016-09-09 DIAGNOSIS — H9203 Otalgia, bilateral: Secondary | ICD-10-CM | POA: Diagnosis not present

## 2016-09-12 DIAGNOSIS — R040 Epistaxis: Secondary | ICD-10-CM | POA: Diagnosis not present

## 2016-09-13 ENCOUNTER — Telehealth (HOSPITAL_COMMUNITY): Payer: Self-pay

## 2016-09-13 NOTE — Telephone Encounter (Signed)
Mom calling with concerns about having mastitis a 2nd time.  Mom reports baby went 4 hours without feeding for 3 consecutive feedings.  Mom did not feel plugged duct, but began with fever and mastitis symptoms.  LC discussed not waiting longer than 3 hours for feedings or pumping during this time of repeated infections.  LC encouraged mom to rotate positioning at the breast. Mom to also monitor for a decrease in her supply to indicate possible pre infection.  LC encouraged mom to get rest and not use formula supplement to treat this issue.  Mom reports FOB as supportive and feels she should be able to relax more and think about resting more.  Mom asked about taking Lecithin, LC advised mom to discussed with OB due to medical history and for dosage.  LC provided support and encouragement for mom.

## 2016-09-18 DIAGNOSIS — R040 Epistaxis: Secondary | ICD-10-CM | POA: Diagnosis not present

## 2016-09-18 DIAGNOSIS — J330 Polyp of nasal cavity: Secondary | ICD-10-CM | POA: Diagnosis not present

## 2016-09-20 DIAGNOSIS — Z6822 Body mass index (BMI) 22.0-22.9, adult: Secondary | ICD-10-CM | POA: Diagnosis not present

## 2016-09-27 DIAGNOSIS — Z9641 Presence of insulin pump (external) (internal): Secondary | ICD-10-CM | POA: Diagnosis not present

## 2016-09-27 DIAGNOSIS — E1065 Type 1 diabetes mellitus with hyperglycemia: Secondary | ICD-10-CM | POA: Diagnosis not present

## 2016-09-27 DIAGNOSIS — E109 Type 1 diabetes mellitus without complications: Secondary | ICD-10-CM | POA: Diagnosis not present

## 2016-10-02 DIAGNOSIS — J330 Polyp of nasal cavity: Secondary | ICD-10-CM | POA: Diagnosis not present

## 2016-10-02 DIAGNOSIS — R040 Epistaxis: Secondary | ICD-10-CM | POA: Diagnosis not present

## 2016-10-11 DIAGNOSIS — Z9641 Presence of insulin pump (external) (internal): Secondary | ICD-10-CM | POA: Diagnosis not present

## 2016-10-11 DIAGNOSIS — E109 Type 1 diabetes mellitus without complications: Secondary | ICD-10-CM | POA: Diagnosis not present

## 2016-10-11 DIAGNOSIS — M25572 Pain in left ankle and joints of left foot: Secondary | ICD-10-CM | POA: Diagnosis not present

## 2016-10-11 DIAGNOSIS — E1065 Type 1 diabetes mellitus with hyperglycemia: Secondary | ICD-10-CM | POA: Diagnosis not present

## 2016-10-22 DIAGNOSIS — E109 Type 1 diabetes mellitus without complications: Secondary | ICD-10-CM | POA: Diagnosis not present

## 2016-10-22 DIAGNOSIS — E038 Other specified hypothyroidism: Secondary | ICD-10-CM | POA: Diagnosis not present

## 2016-10-22 DIAGNOSIS — E063 Autoimmune thyroiditis: Secondary | ICD-10-CM | POA: Diagnosis not present

## 2016-10-23 DIAGNOSIS — R040 Epistaxis: Secondary | ICD-10-CM | POA: Diagnosis not present

## 2016-10-23 DIAGNOSIS — J338 Other polyp of sinus: Secondary | ICD-10-CM | POA: Diagnosis not present

## 2016-11-11 DIAGNOSIS — H524 Presbyopia: Secondary | ICD-10-CM | POA: Diagnosis not present

## 2016-11-11 DIAGNOSIS — E109 Type 1 diabetes mellitus without complications: Secondary | ICD-10-CM | POA: Diagnosis not present

## 2016-11-19 DIAGNOSIS — Z9641 Presence of insulin pump (external) (internal): Secondary | ICD-10-CM | POA: Diagnosis not present

## 2016-11-19 DIAGNOSIS — E1065 Type 1 diabetes mellitus with hyperglycemia: Secondary | ICD-10-CM | POA: Diagnosis not present

## 2016-11-19 DIAGNOSIS — E109 Type 1 diabetes mellitus without complications: Secondary | ICD-10-CM | POA: Diagnosis not present

## 2016-11-20 DIAGNOSIS — R040 Epistaxis: Secondary | ICD-10-CM | POA: Diagnosis not present

## 2016-12-18 DIAGNOSIS — R040 Epistaxis: Secondary | ICD-10-CM | POA: Diagnosis not present

## 2016-12-31 DIAGNOSIS — D225 Melanocytic nevi of trunk: Secondary | ICD-10-CM | POA: Diagnosis not present

## 2016-12-31 DIAGNOSIS — D485 Neoplasm of uncertain behavior of skin: Secondary | ICD-10-CM | POA: Diagnosis not present

## 2016-12-31 DIAGNOSIS — L700 Acne vulgaris: Secondary | ICD-10-CM | POA: Diagnosis not present

## 2016-12-31 DIAGNOSIS — D2372 Other benign neoplasm of skin of left lower limb, including hip: Secondary | ICD-10-CM | POA: Diagnosis not present

## 2017-01-13 DIAGNOSIS — E1065 Type 1 diabetes mellitus with hyperglycemia: Secondary | ICD-10-CM | POA: Diagnosis not present

## 2017-01-13 DIAGNOSIS — E109 Type 1 diabetes mellitus without complications: Secondary | ICD-10-CM | POA: Diagnosis not present

## 2017-01-13 DIAGNOSIS — Z9641 Presence of insulin pump (external) (internal): Secondary | ICD-10-CM | POA: Diagnosis not present

## 2017-02-21 DIAGNOSIS — Z4681 Encounter for fitting and adjustment of insulin pump: Secondary | ICD-10-CM | POA: Diagnosis not present

## 2017-02-21 DIAGNOSIS — E109 Type 1 diabetes mellitus without complications: Secondary | ICD-10-CM | POA: Diagnosis not present

## 2017-02-21 DIAGNOSIS — E038 Other specified hypothyroidism: Secondary | ICD-10-CM | POA: Diagnosis not present

## 2017-04-02 DIAGNOSIS — Z23 Encounter for immunization: Secondary | ICD-10-CM | POA: Diagnosis not present

## 2017-04-15 DIAGNOSIS — Z9641 Presence of insulin pump (external) (internal): Secondary | ICD-10-CM | POA: Diagnosis not present

## 2017-04-15 DIAGNOSIS — E109 Type 1 diabetes mellitus without complications: Secondary | ICD-10-CM | POA: Diagnosis not present

## 2017-04-15 DIAGNOSIS — E1065 Type 1 diabetes mellitus with hyperglycemia: Secondary | ICD-10-CM | POA: Diagnosis not present

## 2017-04-24 DIAGNOSIS — Z9641 Presence of insulin pump (external) (internal): Secondary | ICD-10-CM | POA: Diagnosis not present

## 2017-04-24 DIAGNOSIS — E109 Type 1 diabetes mellitus without complications: Secondary | ICD-10-CM | POA: Diagnosis not present

## 2017-04-24 DIAGNOSIS — E1065 Type 1 diabetes mellitus with hyperglycemia: Secondary | ICD-10-CM | POA: Diagnosis not present

## 2017-05-26 DIAGNOSIS — Z4681 Encounter for fitting and adjustment of insulin pump: Secondary | ICD-10-CM | POA: Diagnosis not present

## 2017-05-26 DIAGNOSIS — E038 Other specified hypothyroidism: Secondary | ICD-10-CM | POA: Diagnosis not present

## 2017-05-26 DIAGNOSIS — E109 Type 1 diabetes mellitus without complications: Secondary | ICD-10-CM | POA: Diagnosis not present

## 2017-07-16 DIAGNOSIS — J019 Acute sinusitis, unspecified: Secondary | ICD-10-CM | POA: Diagnosis not present

## 2017-07-22 IMAGING — US US MFM FETAL BPP W/O NON-STRESS
1 series · 12 of 28 positions shown · non-contrast
Comparison: none

[Series 1: us mfm fetal bpp w/o non-stress · 30 acquisitions, 12 frames shown]
[im 2/30]
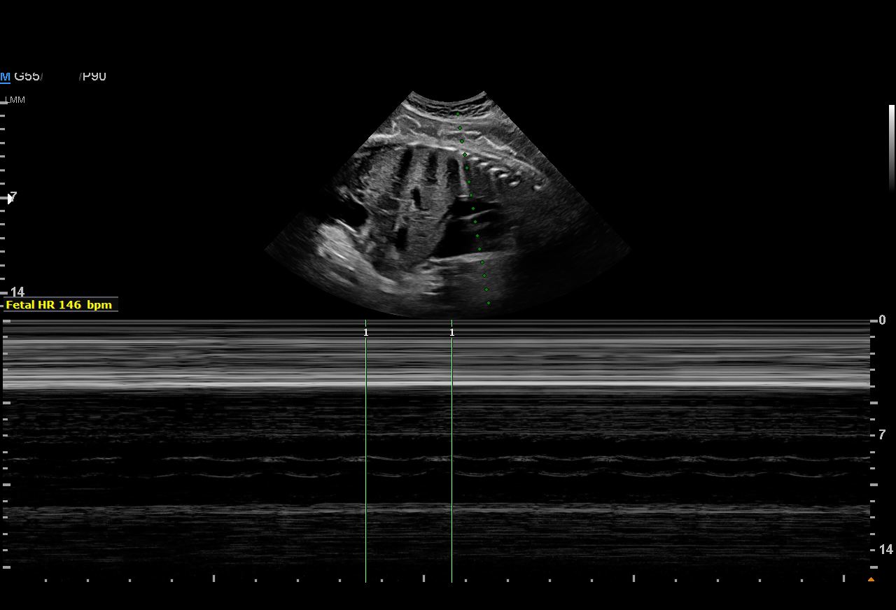
[im 4/30]
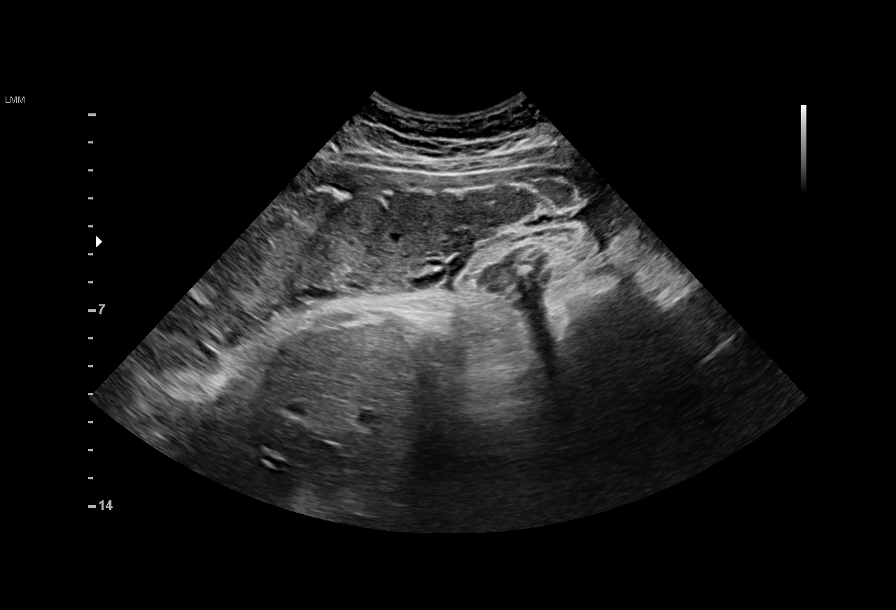
[im 6/30]
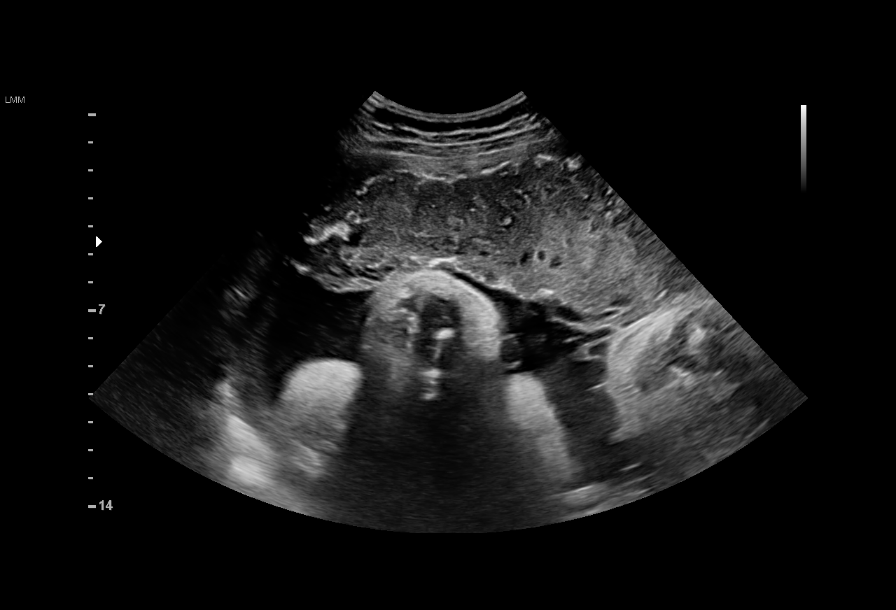
[im 9/30]
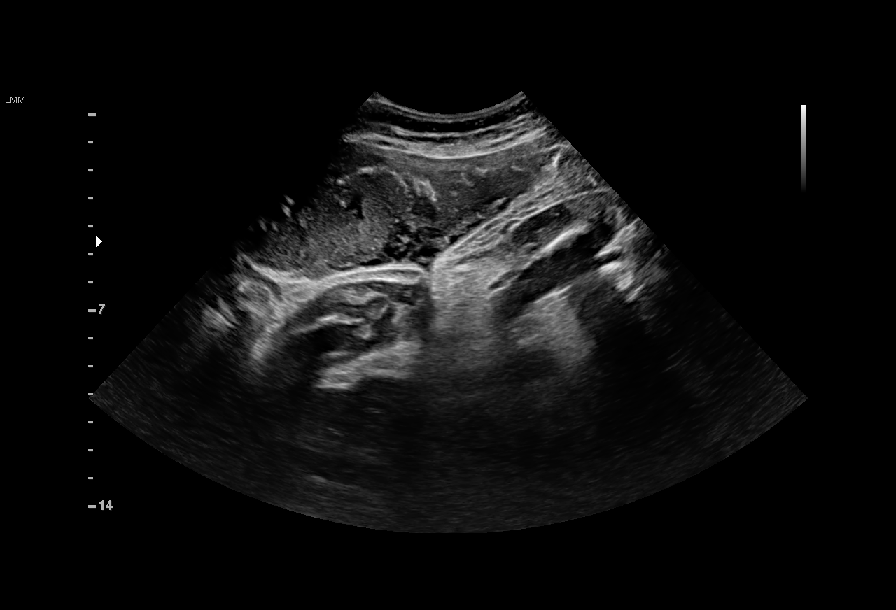
[im 11/30]
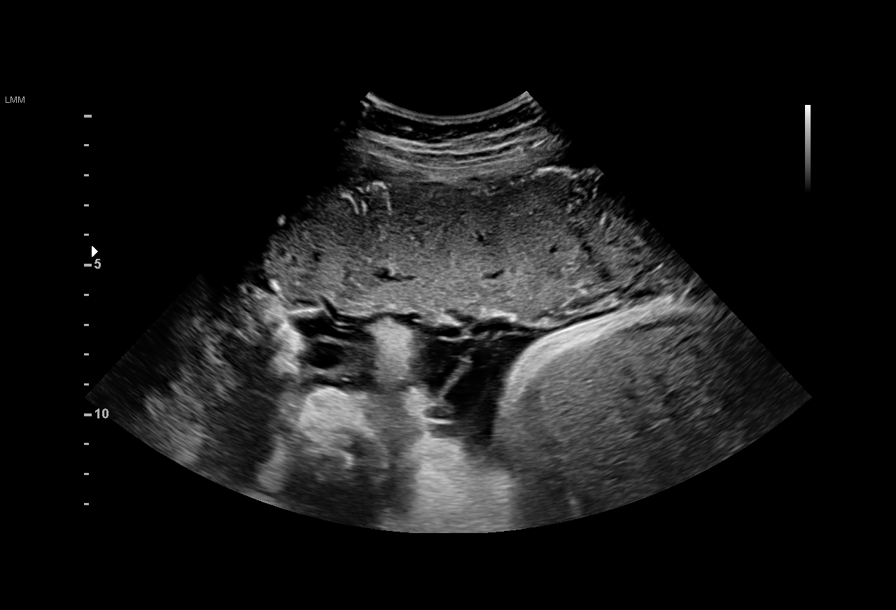
[im 13/30]
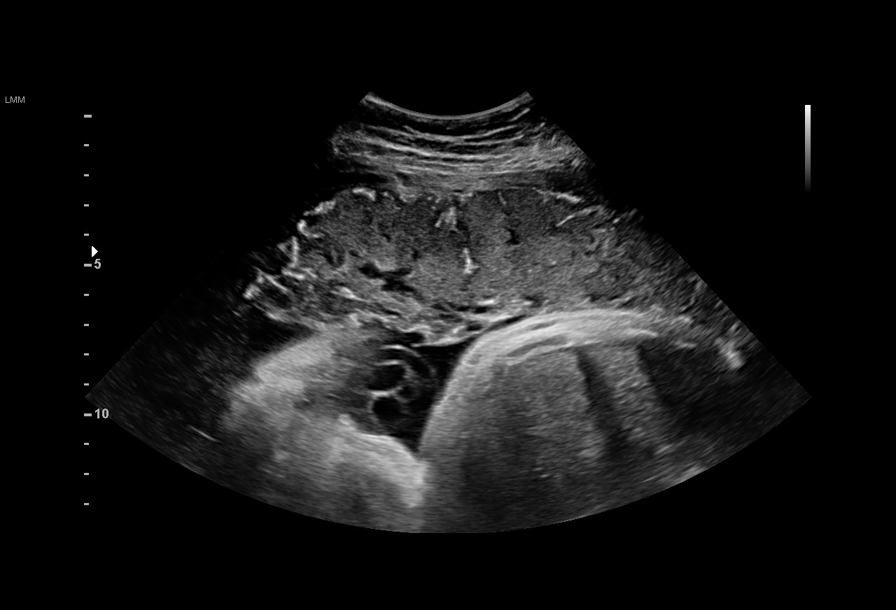
[im 17/30]
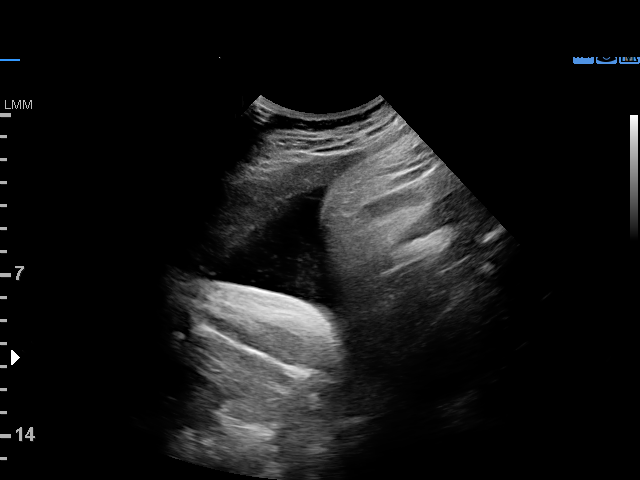
[im 19/30]
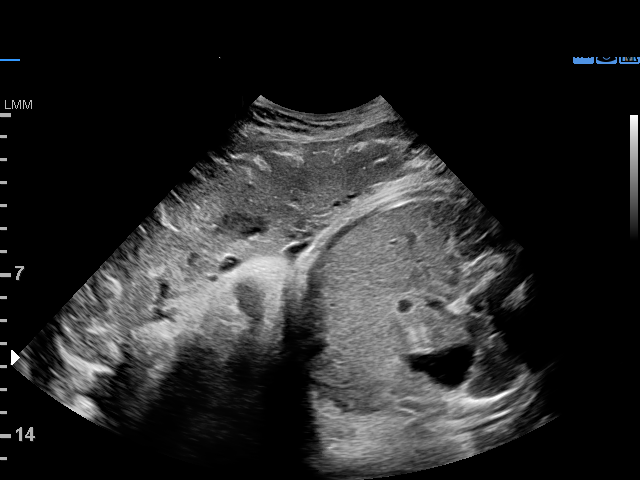
[im 21/30]
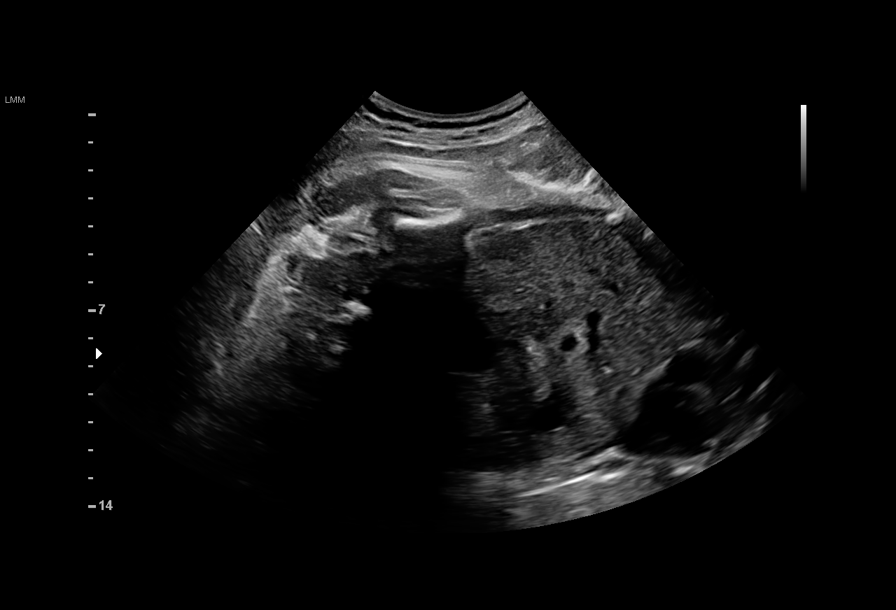
[im 24/30]
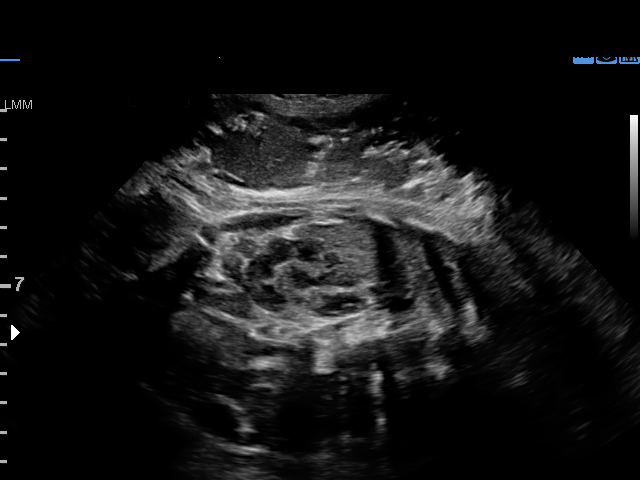
[im 26/30]
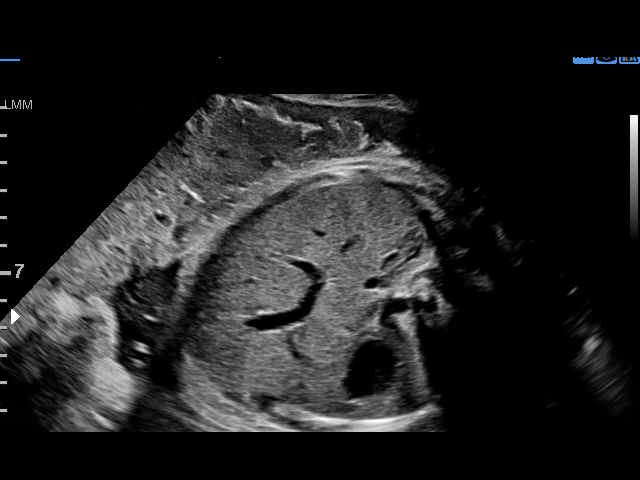
[im 28/30]
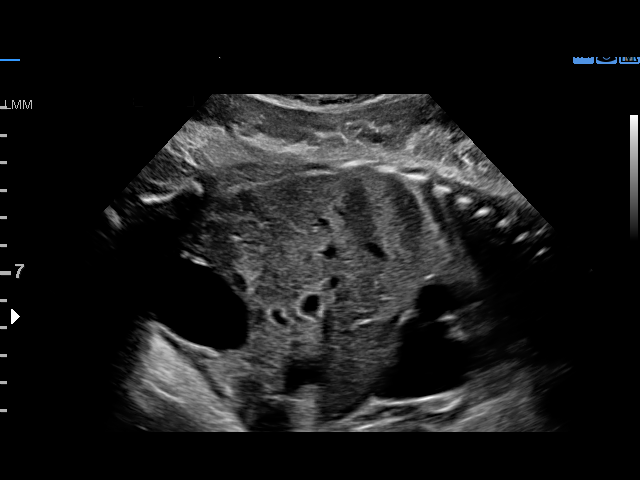

[12 of 28 positions shown; findings below may reference images not displayed]

[facility]
Attending:        Fode Morand       Secondary Phy.:    [id number]-
MAU/Triage

1  DIANELIS               [id number]      [phone]     [id number]
[place]
2  DIANELIS               [id number]      [phone]     [id number]
[place]
Indications

38 weeks gestation of pregnancy
Pre-existing diabetes, type 1, in pregnancy,
third trimester
OB History

Gravidity:    1
Fetal Evaluation

Num Of Fetuses:     1
Fetal Heart         146
Rate(bpm):
Cardiac Activity:   Observed
Presentation:       Cephalic
Placenta:           Anterior, above cervical os

Amniotic Fluid
AFI FV:      Subjectively low-normal

AFI Sum(cm)     %Tile       Largest Pocket(cm)
10.34           30

RUQ(cm)                     LUQ(cm)
4.27
Biophysical Evaluation

Amniotic F.V:   Pocket => 2 cm two         F. Tone:         Observed
planes
F. Movement:    Observed                   Score:           [DATE]
F. Breathing:   Observed
Gestational Age

LMP:           38w 5d        Date:  11/07/15                 EDD:   08/13/16
Best:          38w 5d     Det. By:  LMP  (11/07/15)          EDD:   08/13/16
Impression

SIUP at 32w4d (remote read of ultrasound only)
active fetus
AFI is normal
BPP [DATE]
Recommendations

Continue antenatal testing and fetal Michael Anthony Basco until delivery
at 39 weeks if not indicated sooner clinically.

## 2017-08-11 DIAGNOSIS — E109 Type 1 diabetes mellitus without complications: Secondary | ICD-10-CM | POA: Diagnosis not present

## 2017-08-11 DIAGNOSIS — E1065 Type 1 diabetes mellitus with hyperglycemia: Secondary | ICD-10-CM | POA: Diagnosis not present

## 2017-08-11 DIAGNOSIS — Z9641 Presence of insulin pump (external) (internal): Secondary | ICD-10-CM | POA: Diagnosis not present

## 2017-09-12 DIAGNOSIS — E109 Type 1 diabetes mellitus without complications: Secondary | ICD-10-CM | POA: Diagnosis not present

## 2017-09-12 DIAGNOSIS — Z9641 Presence of insulin pump (external) (internal): Secondary | ICD-10-CM | POA: Diagnosis not present

## 2017-09-12 DIAGNOSIS — E1065 Type 1 diabetes mellitus with hyperglycemia: Secondary | ICD-10-CM | POA: Diagnosis not present

## 2017-10-02 DIAGNOSIS — O24012 Pre-existing diabetes mellitus, type 1, in pregnancy, second trimester: Secondary | ICD-10-CM | POA: Diagnosis not present

## 2017-10-02 DIAGNOSIS — E109 Type 1 diabetes mellitus without complications: Secondary | ICD-10-CM | POA: Diagnosis not present

## 2017-10-02 DIAGNOSIS — E038 Other specified hypothyroidism: Secondary | ICD-10-CM | POA: Diagnosis not present

## 2017-10-22 DIAGNOSIS — E109 Type 1 diabetes mellitus without complications: Secondary | ICD-10-CM | POA: Diagnosis not present

## 2017-10-22 DIAGNOSIS — E1065 Type 1 diabetes mellitus with hyperglycemia: Secondary | ICD-10-CM | POA: Diagnosis not present

## 2017-10-22 DIAGNOSIS — Z9641 Presence of insulin pump (external) (internal): Secondary | ICD-10-CM | POA: Diagnosis not present

## 2017-11-20 DIAGNOSIS — Z01419 Encounter for gynecological examination (general) (routine) without abnormal findings: Secondary | ICD-10-CM | POA: Diagnosis not present

## 2017-11-20 DIAGNOSIS — Z6822 Body mass index (BMI) 22.0-22.9, adult: Secondary | ICD-10-CM | POA: Diagnosis not present

## 2017-12-11 DIAGNOSIS — E109 Type 1 diabetes mellitus without complications: Secondary | ICD-10-CM | POA: Diagnosis not present

## 2017-12-11 DIAGNOSIS — Z9641 Presence of insulin pump (external) (internal): Secondary | ICD-10-CM | POA: Diagnosis not present

## 2017-12-11 DIAGNOSIS — E1065 Type 1 diabetes mellitus with hyperglycemia: Secondary | ICD-10-CM | POA: Diagnosis not present

## 2018-01-15 DIAGNOSIS — E109 Type 1 diabetes mellitus without complications: Secondary | ICD-10-CM | POA: Diagnosis not present

## 2018-01-15 DIAGNOSIS — E038 Other specified hypothyroidism: Secondary | ICD-10-CM | POA: Diagnosis not present

## 2018-01-15 DIAGNOSIS — E063 Autoimmune thyroiditis: Secondary | ICD-10-CM | POA: Diagnosis not present

## 2018-01-23 DIAGNOSIS — D225 Melanocytic nevi of trunk: Secondary | ICD-10-CM | POA: Diagnosis not present

## 2018-01-23 DIAGNOSIS — D2272 Melanocytic nevi of left lower limb, including hip: Secondary | ICD-10-CM | POA: Diagnosis not present

## 2018-01-23 DIAGNOSIS — D2261 Melanocytic nevi of right upper limb, including shoulder: Secondary | ICD-10-CM | POA: Diagnosis not present

## 2018-03-11 DIAGNOSIS — E1065 Type 1 diabetes mellitus with hyperglycemia: Secondary | ICD-10-CM | POA: Diagnosis not present

## 2018-03-11 DIAGNOSIS — Z9641 Presence of insulin pump (external) (internal): Secondary | ICD-10-CM | POA: Diagnosis not present

## 2018-03-11 DIAGNOSIS — E109 Type 1 diabetes mellitus without complications: Secondary | ICD-10-CM | POA: Diagnosis not present

## 2018-03-31 DIAGNOSIS — Z23 Encounter for immunization: Secondary | ICD-10-CM | POA: Diagnosis not present

## 2018-05-26 DIAGNOSIS — E063 Autoimmune thyroiditis: Secondary | ICD-10-CM | POA: Diagnosis not present

## 2018-05-26 DIAGNOSIS — E038 Other specified hypothyroidism: Secondary | ICD-10-CM | POA: Diagnosis not present

## 2018-05-26 DIAGNOSIS — E109 Type 1 diabetes mellitus without complications: Secondary | ICD-10-CM | POA: Diagnosis not present

## 2018-06-10 DIAGNOSIS — Z9641 Presence of insulin pump (external) (internal): Secondary | ICD-10-CM | POA: Diagnosis not present

## 2018-06-10 DIAGNOSIS — E109 Type 1 diabetes mellitus without complications: Secondary | ICD-10-CM | POA: Diagnosis not present

## 2018-06-10 DIAGNOSIS — E1065 Type 1 diabetes mellitus with hyperglycemia: Secondary | ICD-10-CM | POA: Diagnosis not present

## 2018-07-31 DIAGNOSIS — Z9641 Presence of insulin pump (external) (internal): Secondary | ICD-10-CM | POA: Diagnosis not present

## 2018-07-31 DIAGNOSIS — E1065 Type 1 diabetes mellitus with hyperglycemia: Secondary | ICD-10-CM | POA: Diagnosis not present

## 2018-07-31 DIAGNOSIS — E109 Type 1 diabetes mellitus without complications: Secondary | ICD-10-CM | POA: Diagnosis not present

## 2018-09-03 DIAGNOSIS — E1065 Type 1 diabetes mellitus with hyperglycemia: Secondary | ICD-10-CM | POA: Diagnosis not present

## 2018-09-03 DIAGNOSIS — E109 Type 1 diabetes mellitus without complications: Secondary | ICD-10-CM | POA: Diagnosis not present

## 2018-09-03 DIAGNOSIS — Z9641 Presence of insulin pump (external) (internal): Secondary | ICD-10-CM | POA: Diagnosis not present
